# Patient Record
Sex: Female | Born: 1960 | Race: White | Hispanic: No | Smoking: Former smoker
Health system: Southern US, Community
[De-identification: ages and names within clinical notes are randomized; demographics above are authoritative.]

## PROBLEM LIST (undated history)

## (undated) DIAGNOSIS — B009 Herpesviral infection, unspecified: Secondary | ICD-10-CM

## (undated) DIAGNOSIS — J45909 Unspecified asthma, uncomplicated: Secondary | ICD-10-CM

## (undated) DIAGNOSIS — B1920 Unspecified viral hepatitis C without hepatic coma: Secondary | ICD-10-CM

## (undated) DIAGNOSIS — F319 Bipolar disorder, unspecified: Secondary | ICD-10-CM

## (undated) HISTORY — PX: APPENDECTOMY: SHX54

## (undated) HISTORY — PX: TONSILLECTOMY: SUR1361

---

## 1997-08-01 ENCOUNTER — Inpatient Hospital Stay (HOSPITAL_COMMUNITY): Admission: AD | Admit: 1997-08-01 | Discharge: 1997-08-01 | Payer: Self-pay | Admitting: Obstetrics & Gynecology

## 1997-08-06 ENCOUNTER — Inpatient Hospital Stay (HOSPITAL_COMMUNITY): Admission: RE | Admit: 1997-08-06 | Discharge: 1997-08-06 | Payer: Self-pay | Admitting: Obstetrics & Gynecology

## 1997-08-25 ENCOUNTER — Inpatient Hospital Stay (HOSPITAL_COMMUNITY): Admission: AD | Admit: 1997-08-25 | Discharge: 1997-08-25 | Payer: Self-pay | Admitting: Obstetrics

## 1997-09-09 ENCOUNTER — Encounter: Admission: RE | Admit: 1997-09-09 | Discharge: 1997-12-08 | Payer: Self-pay | Admitting: Obstetrics

## 1997-09-30 ENCOUNTER — Inpatient Hospital Stay (HOSPITAL_COMMUNITY): Admission: AD | Admit: 1997-09-30 | Discharge: 1997-09-30 | Payer: Self-pay | Admitting: Obstetrics & Gynecology

## 1997-10-10 ENCOUNTER — Inpatient Hospital Stay (HOSPITAL_COMMUNITY): Admission: AD | Admit: 1997-10-10 | Discharge: 1997-10-13 | Payer: Self-pay | Admitting: Obstetrics

## 1998-09-19 ENCOUNTER — Emergency Department (HOSPITAL_COMMUNITY): Admission: EM | Admit: 1998-09-19 | Discharge: 1998-09-19 | Payer: Self-pay | Admitting: Emergency Medicine

## 1999-01-25 ENCOUNTER — Encounter: Payer: Self-pay | Admitting: Emergency Medicine

## 1999-01-25 ENCOUNTER — Emergency Department (HOSPITAL_COMMUNITY): Admission: EM | Admit: 1999-01-25 | Discharge: 1999-01-25 | Payer: Self-pay | Admitting: Emergency Medicine

## 2003-10-24 ENCOUNTER — Emergency Department (HOSPITAL_COMMUNITY): Admission: EM | Admit: 2003-10-24 | Discharge: 2003-10-24 | Payer: Self-pay | Admitting: Emergency Medicine

## 2004-07-07 ENCOUNTER — Emergency Department (HOSPITAL_COMMUNITY): Admission: EM | Admit: 2004-07-07 | Discharge: 2004-07-08 | Payer: Self-pay | Admitting: Emergency Medicine

## 2004-07-07 ENCOUNTER — Encounter (INDEPENDENT_AMBULATORY_CARE_PROVIDER_SITE_OTHER): Payer: Self-pay | Admitting: Specialist

## 2005-01-04 ENCOUNTER — Emergency Department (HOSPITAL_COMMUNITY): Admission: EM | Admit: 2005-01-04 | Discharge: 2005-01-04 | Payer: Self-pay | Admitting: Family Medicine

## 2005-04-18 ENCOUNTER — Ambulatory Visit: Payer: Self-pay | Admitting: Internal Medicine

## 2005-05-02 ENCOUNTER — Ambulatory Visit: Payer: Self-pay | Admitting: Internal Medicine

## 2005-05-09 ENCOUNTER — Other Ambulatory Visit: Admission: RE | Admit: 2005-05-09 | Discharge: 2005-05-09 | Payer: Self-pay | Admitting: Internal Medicine

## 2005-05-09 ENCOUNTER — Ambulatory Visit: Payer: Self-pay | Admitting: Internal Medicine

## 2005-05-14 ENCOUNTER — Ambulatory Visit (HOSPITAL_COMMUNITY): Admission: RE | Admit: 2005-05-14 | Discharge: 2005-05-14 | Payer: Self-pay | Admitting: Internal Medicine

## 2005-05-15 ENCOUNTER — Ambulatory Visit: Payer: Self-pay | Admitting: Internal Medicine

## 2006-12-07 IMAGING — CR DG LUMBAR SPINE COMPLETE 4+V
5 series · 5 of 5 positions shown · non-contrast
Comparison: none

CLINICAL DATA: Low neck pain.  Assess for osteoarthritis.  
CERVICAL SPINE ? 7 VIEW:
CLINICAL DATA: Pain between the shoulders.
THORACIC SPINE ? 2 VIEW:
CLINICAL DATA: Low mid back pain.
LUMBAR SPINE ? 5 VIEW:

[view not recorded (1 of 5)]
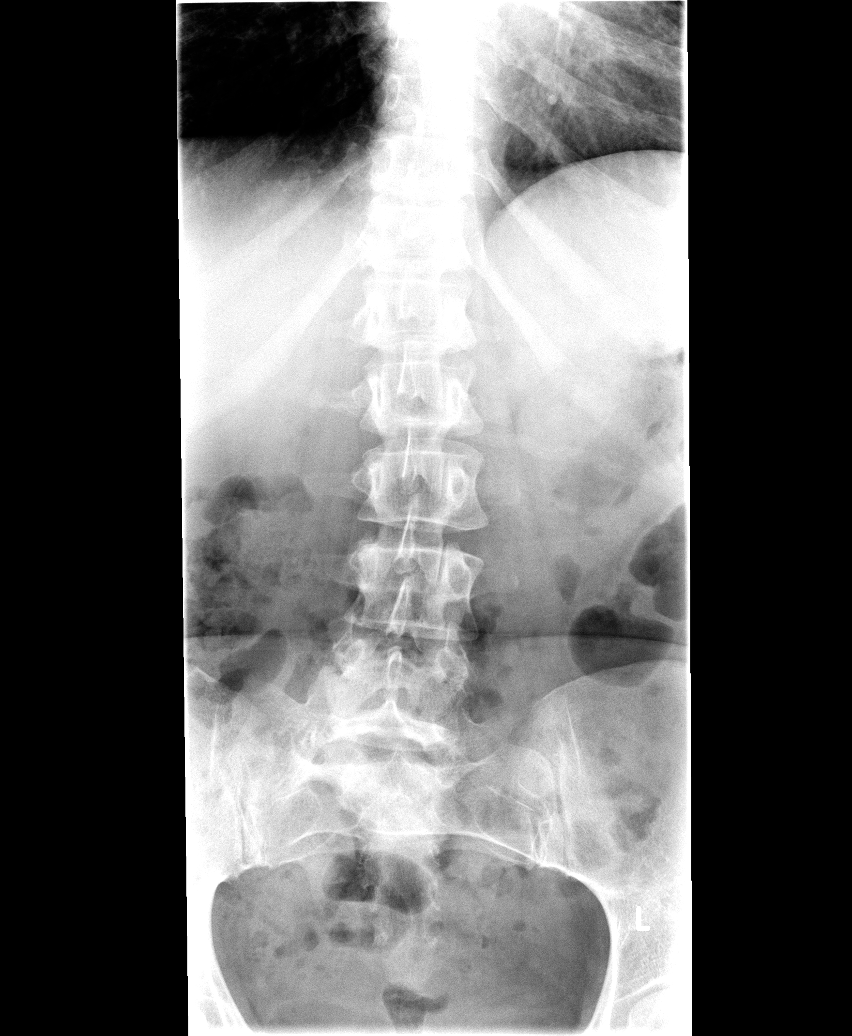

[view not recorded (2 of 5)]
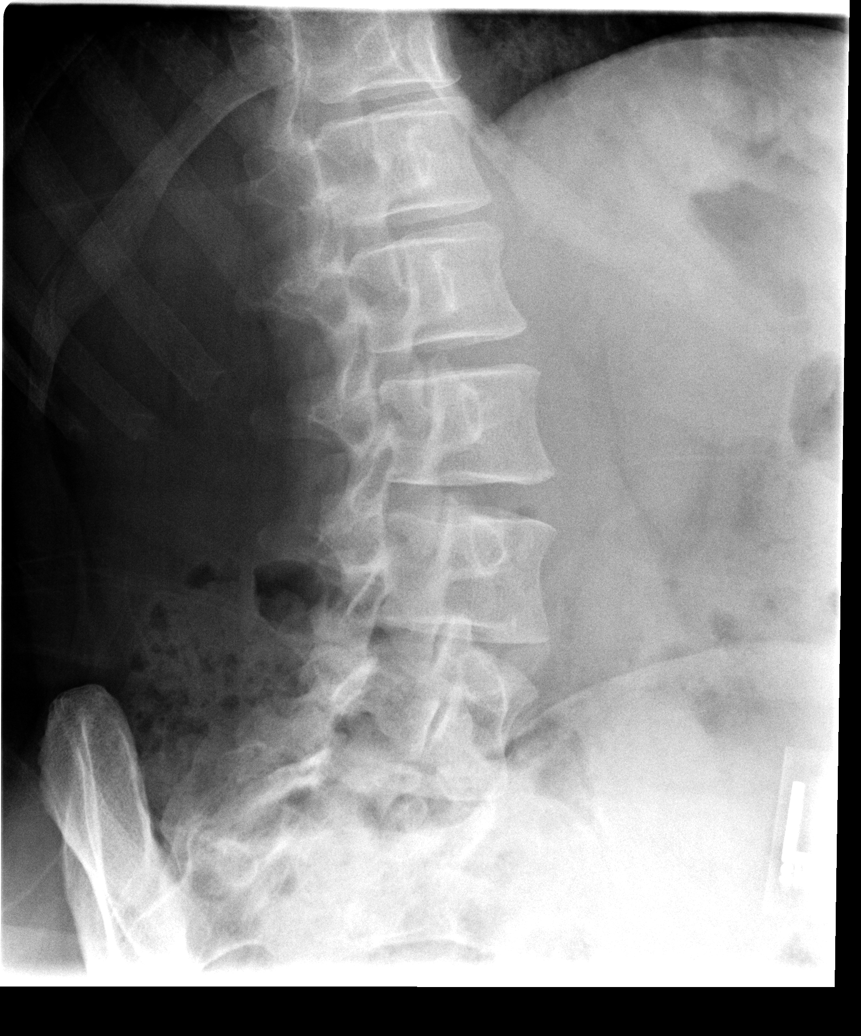

[view not recorded (3 of 5)]
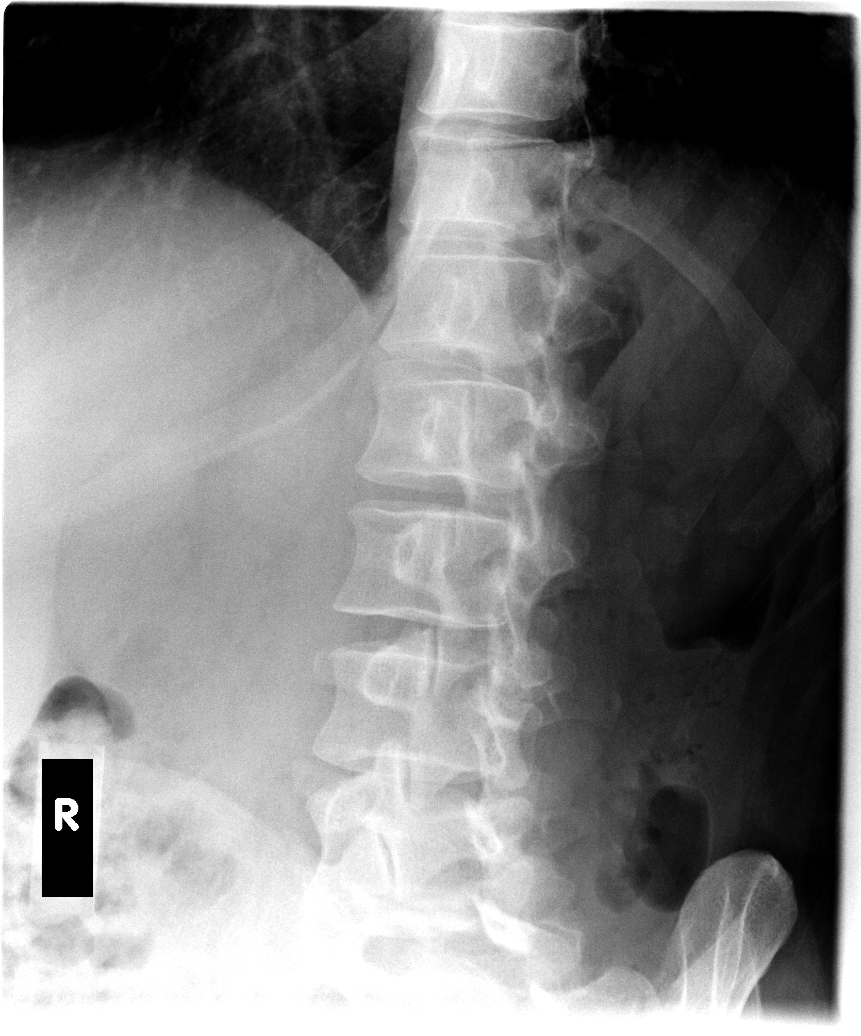

[view not recorded (4 of 5)]
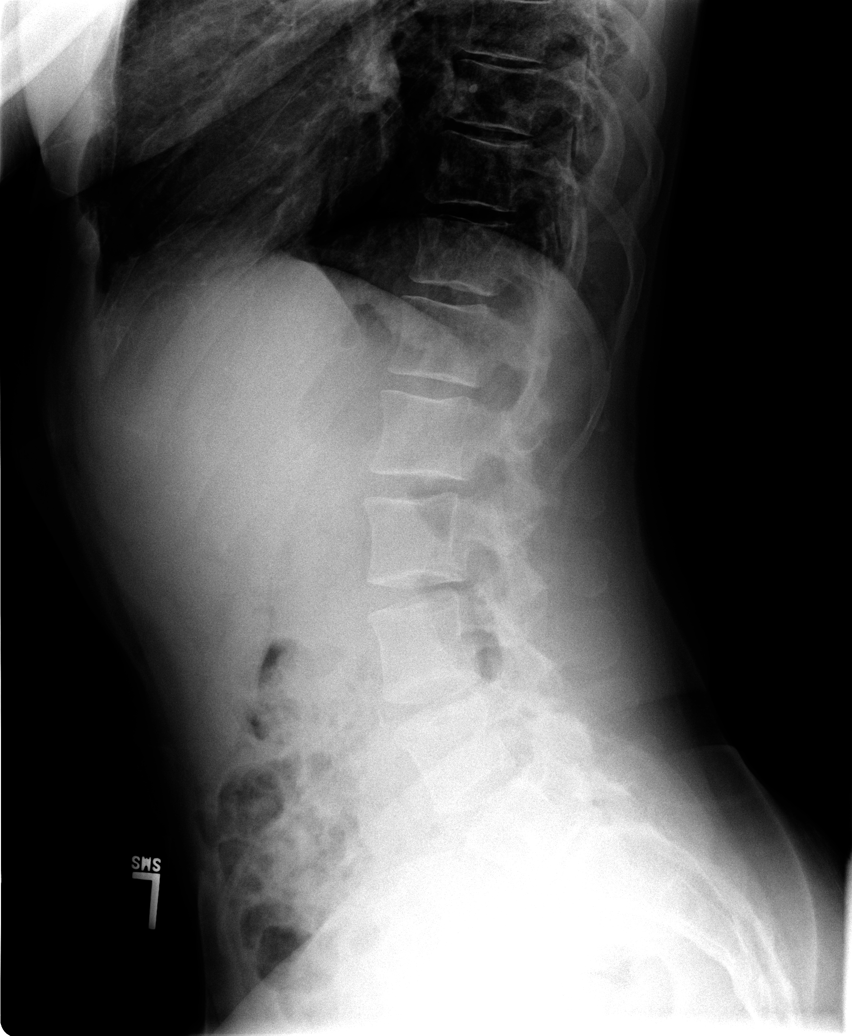

[view not recorded (5 of 5)]
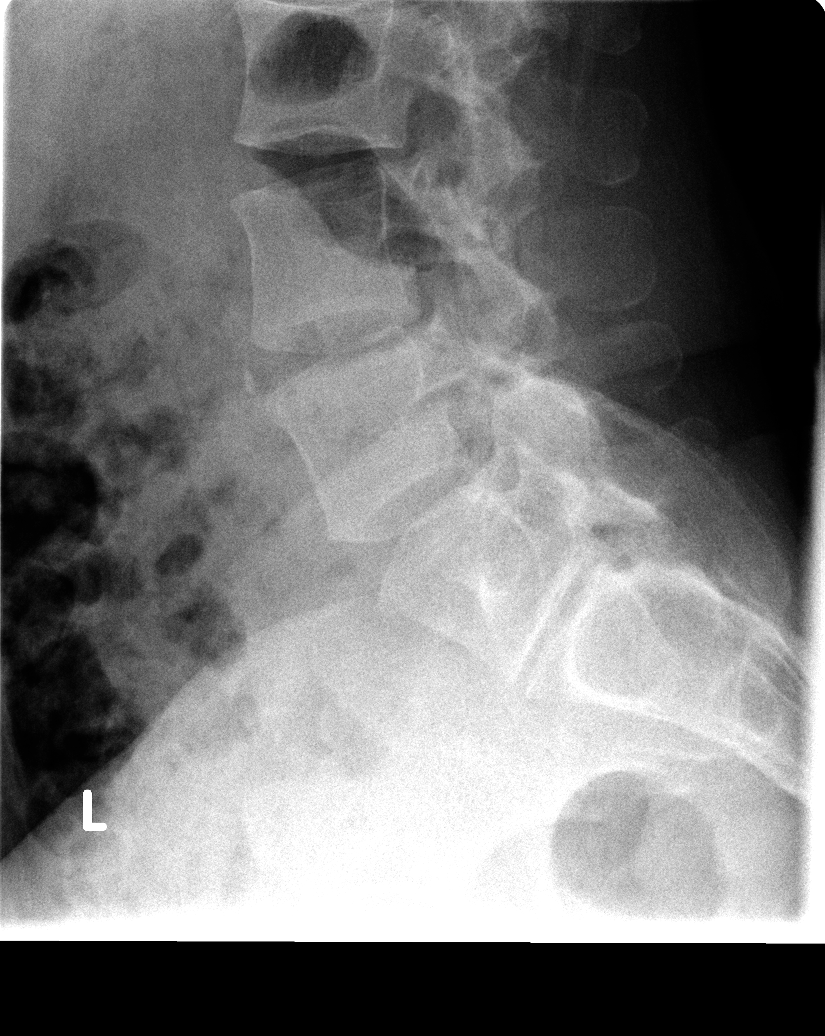

[5 of 5 positions shown; findings below may reference images not displayed]

FINDINGS: Seven views of the neck were obtained.  Bone density is within normal limits.  
The lateral view demonstrates a loss of the normal cervical lordotic curve with slightly diminished vertebral height at the C5-6 level.  arked disc space narrowing at this level with associated anterior osteophytosis and uncovertebral spurring bilaterally with associated foraminal narrowing at the C5-6 level.  These changes are compatible with osteoarthritis.  The remainder of the vertebral bodies appear within normal limits and the other foramina appear patent.  No focal soft tissue abnormalities are seen and the lung apices appear clear.  No acute bony abnormalities are noted.
IMPRESSION: Degenerative changes at the C5-6 level with associated foraminal narrowing due to bilateral uncovertebral spurring.
FINDINGS: Bone density is within normal limits.  There is very mild curvature of the thoracic spine convex right.  Vertebral body heights and disc spaces appear maintained.  Bony alignment appears intact.  No acute bony abnormality is seen.  There is mild anterior vertebral body lipping at the C6, 7 and 8 levels suggestive of mild and very early degenerative changes.
IMPRESSION: Mild early degenerative changes of the mid thoracic spine as noted above.
FINDINGS: Bone density is within normal limits. There is a mild compensatory curve of the lumbar spine convex left with a slight rotatory component.  There are five normal appearing lumbar vertebral bodies with a sixth transitional vertebra identified.  This L6 transitional vertebra demonstrates assimilation with the sacroiliac joint on the right, but not on the left.  
Bony alignment appears intact.  There is a rudimentary joint space identified between the L6 vertebral body and the sacrum.  Oblique views demonstrate no evidence for spondylosis or spondylolisthesis.  No bony degenerative changes are evident.
IMPRESSION: Development anomaly of a transitional L6 vertebral body as described above.  The patient?s mild spinal curvature with rotatory component of the lumbar spine is likely related to the asymmetry of this transitional L6 vertebral body.  No significant degenerative changes are evident and no acute bony abnormalities are noted in the lumbar spine.

## 2006-12-07 IMAGING — CR DG THORACIC SPINE 2V
2 series · 2 of 2 positions shown · non-contrast
Comparison: none

CLINICAL DATA: Low neck pain.  Assess for osteoarthritis.  
CERVICAL SPINE ? 7 VIEW:
CLINICAL DATA: Pain between the shoulders.
THORACIC SPINE ? 2 VIEW:
CLINICAL DATA: Low mid back pain.
LUMBAR SPINE ? 5 VIEW:

[view not recorded (1 of 2)]
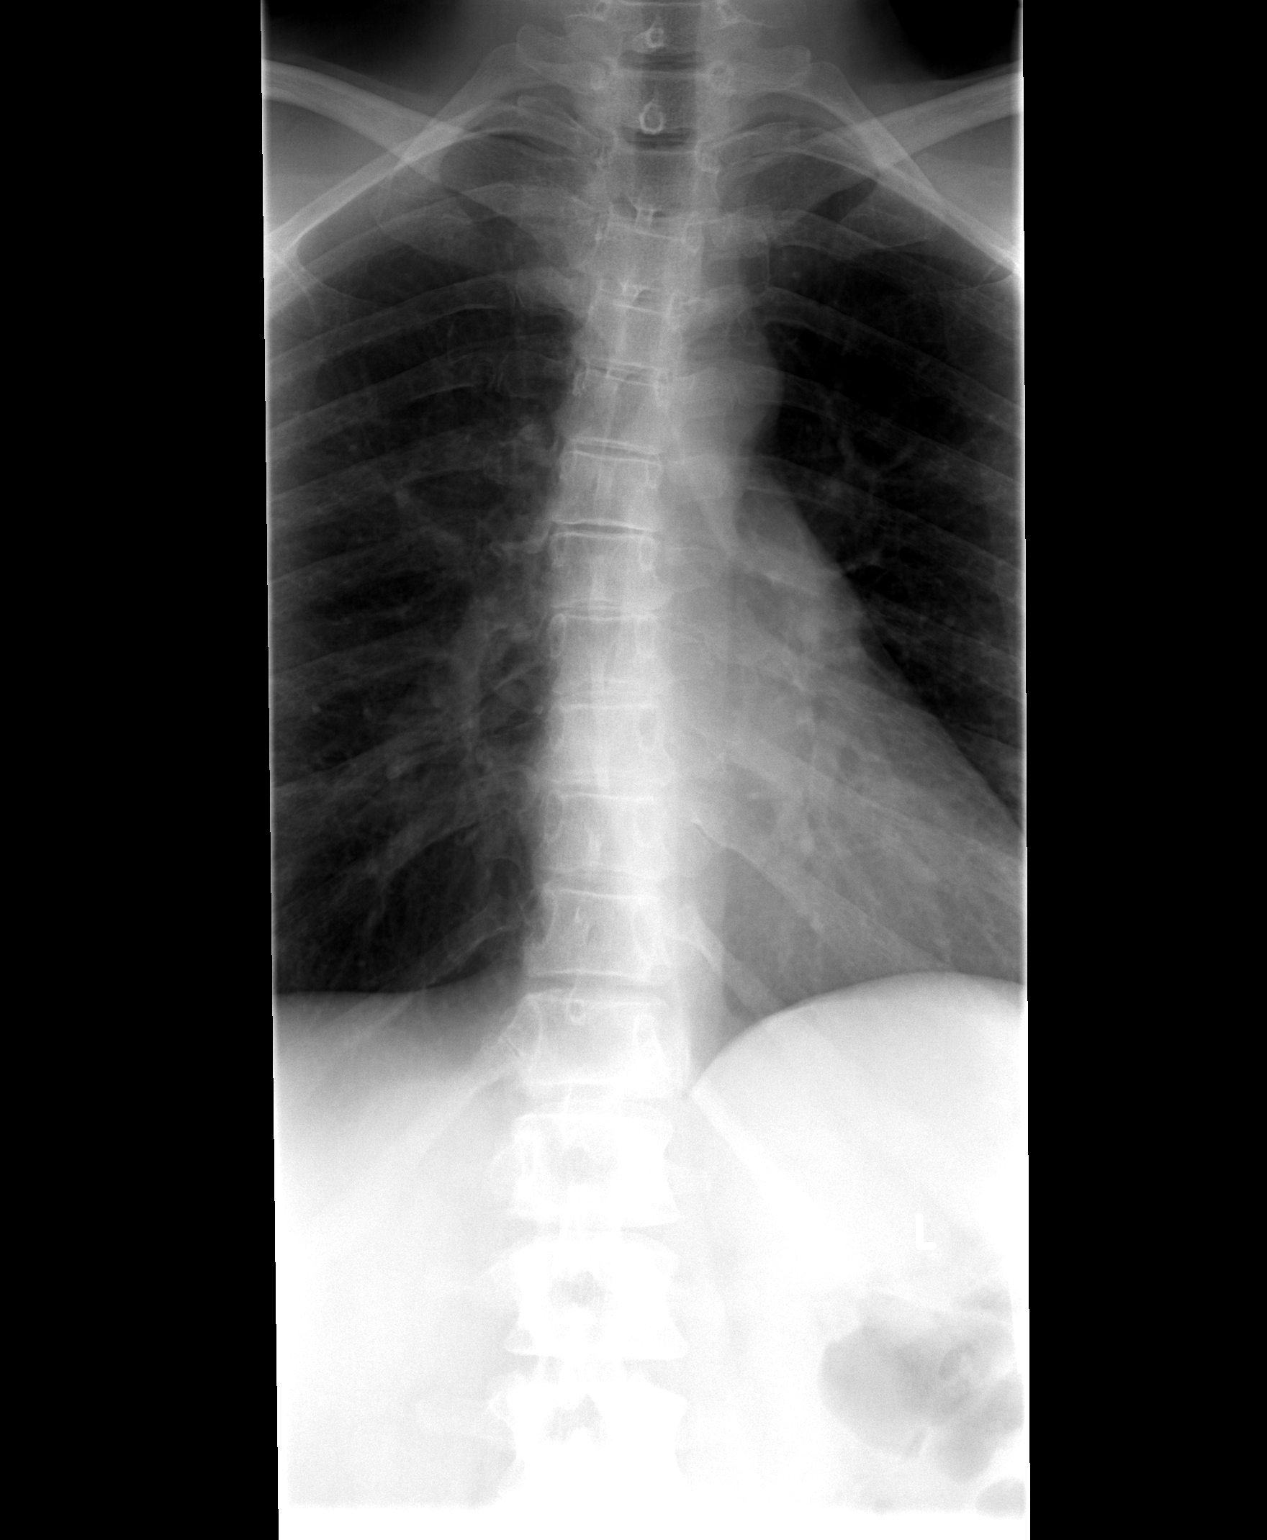

[view not recorded (2 of 2)]
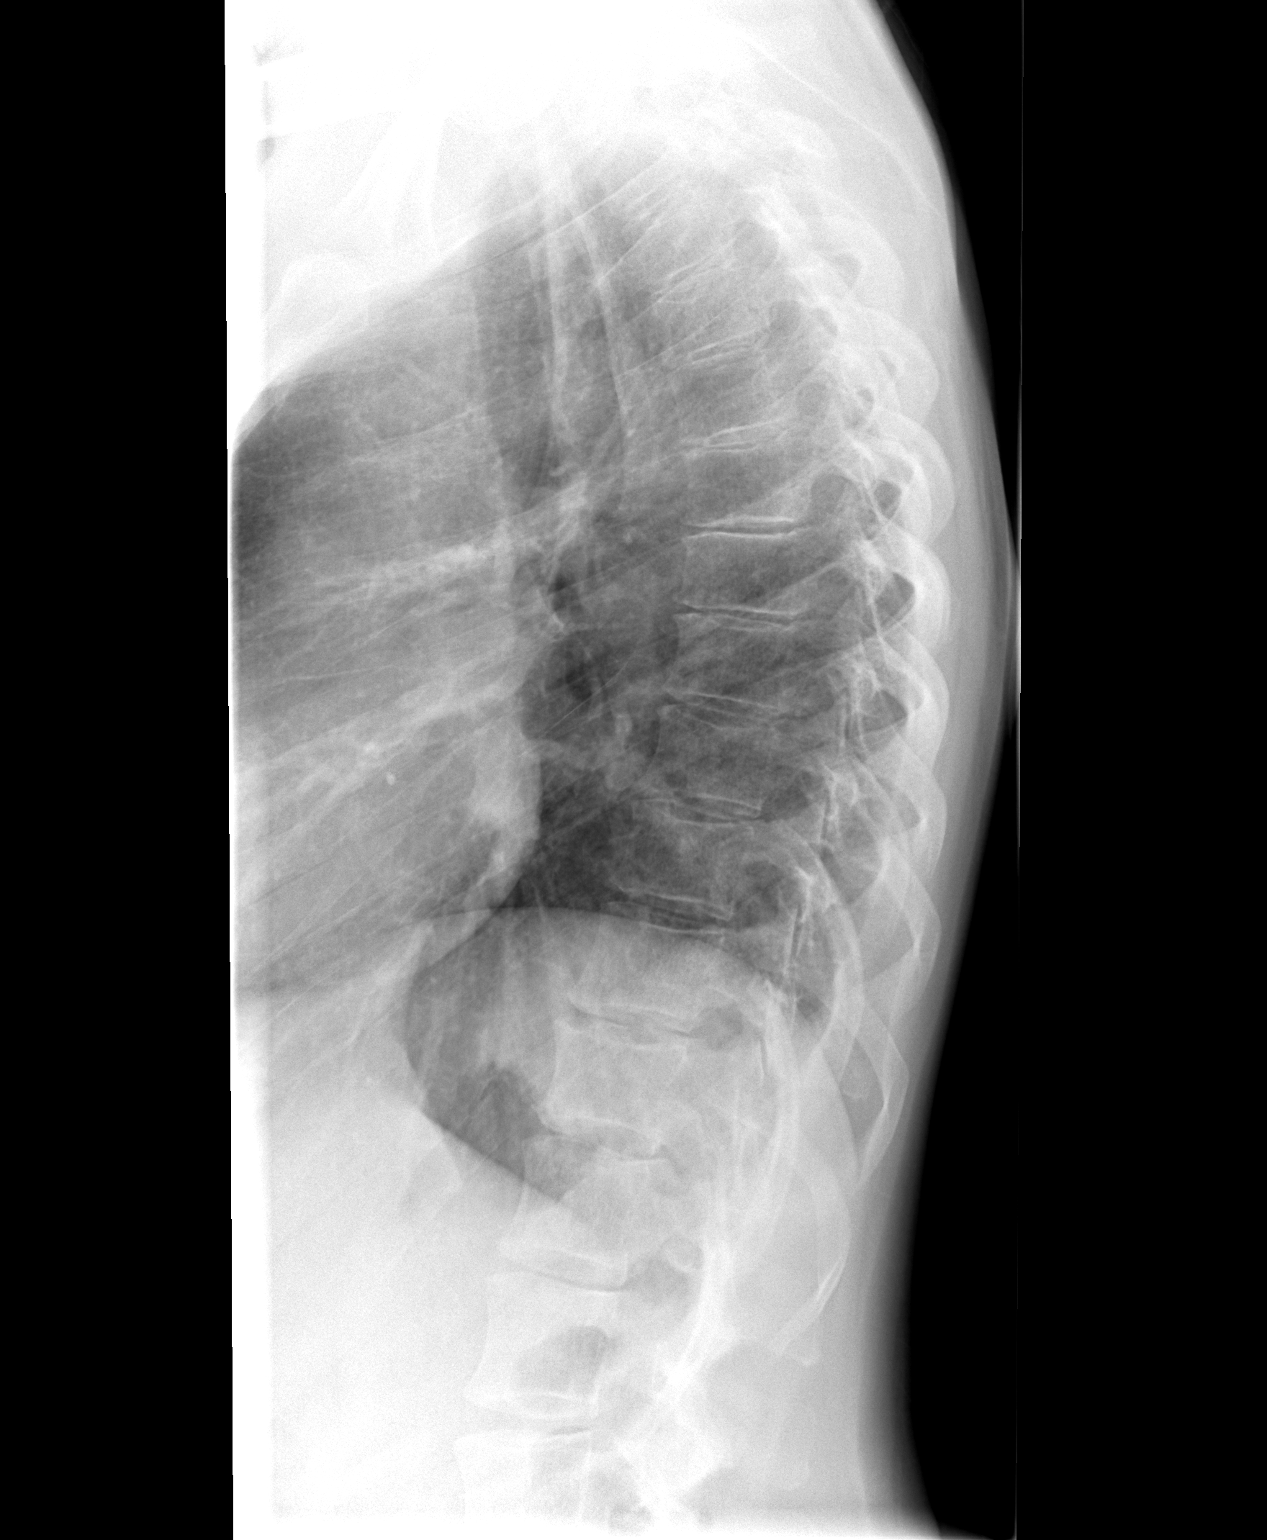

[2 of 2 positions shown; findings below may reference images not displayed]

FINDINGS: Seven views of the neck were obtained.  Bone density is within normal limits.  
The lateral view demonstrates a loss of the normal cervical lordotic curve with slightly diminished vertebral height at the C5-6 level.  arked disc space narrowing at this level with associated anterior osteophytosis and uncovertebral spurring bilaterally with associated foraminal narrowing at the C5-6 level.  These changes are compatible with osteoarthritis.  The remainder of the vertebral bodies appear within normal limits and the other foramina appear patent.  No focal soft tissue abnormalities are seen and the lung apices appear clear.  No acute bony abnormalities are noted.
IMPRESSION: Degenerative changes at the C5-6 level with associated foraminal narrowing due to bilateral uncovertebral spurring.
FINDINGS: Bone density is within normal limits.  There is very mild curvature of the thoracic spine convex right.  Vertebral body heights and disc spaces appear maintained.  Bony alignment appears intact.  No acute bony abnormality is seen.  There is mild anterior vertebral body lipping at the C6, 7 and 8 levels suggestive of mild and very early degenerative changes.
IMPRESSION: Mild early degenerative changes of the mid thoracic spine as noted above.
FINDINGS: Bone density is within normal limits. There is a mild compensatory curve of the lumbar spine convex left with a slight rotatory component.  There are five normal appearing lumbar vertebral bodies with a sixth transitional vertebra identified.  This L6 transitional vertebra demonstrates assimilation with the sacroiliac joint on the right, but not on the left.  
Bony alignment appears intact.  There is a rudimentary joint space identified between the L6 vertebral body and the sacrum.  Oblique views demonstrate no evidence for spondylosis or spondylolisthesis.  No bony degenerative changes are evident.
IMPRESSION: Development anomaly of a transitional L6 vertebral body as described above.  The patient?s mild spinal curvature with rotatory component of the lumbar spine is likely related to the asymmetry of this transitional L6 vertebral body.  No significant degenerative changes are evident and no acute bony abnormalities are noted in the lumbar spine.

## 2009-07-20 ENCOUNTER — Encounter: Payer: Self-pay | Admitting: Internal Medicine

## 2009-07-20 DIAGNOSIS — F438 Other reactions to severe stress: Secondary | ICD-10-CM

## 2010-07-11 NOTE — Assessment & Plan Note (Signed)
    Impression & Recommendations:  Problem # 1:  OTHER ACUTE REACTIONS TO STRESS (ICD-308.3) I have been caring for Emma May's father for the last several months. He has been hospitalized several times and his health has declined greatly. Today we got them hooked up with Hospice. She is exhausted and very stressed. In the past she has seen Dr. Wyonia Hough but has not been in for a while. I have given her a prescription for Ativan .5mg  1/2-2 by mouth three times a day as needed #60, with one refill. She is to let me know how she is doing in a week.  Problem # 2:  Preventive Health Care (ICD-V70.0) Pt. needs to come in for primary care once things have settled down.  Complete Medication List: 1)  Lorazepam 0.5 Mg Tabs (Lorazepam) .... 1/2-2 tablets 3 times a day as needed

## 2010-08-07 ENCOUNTER — Telehealth: Payer: Self-pay | Admitting: *Deleted

## 2010-08-07 NOTE — Telephone Encounter (Signed)
Incorrect pt  

## 2012-06-25 ENCOUNTER — Emergency Department (HOSPITAL_COMMUNITY): Payer: Medicaid Other

## 2012-06-25 ENCOUNTER — Observation Stay (HOSPITAL_COMMUNITY)
Admission: EM | Admit: 2012-06-25 | Discharge: 2012-06-27 | Disposition: A | Discharge status: Home / Self Care | Payer: Medicaid Other | Attending: General Surgery | Admitting: General Surgery

## 2012-06-25 ENCOUNTER — Encounter (HOSPITAL_COMMUNITY): Payer: Self-pay | Admitting: Orthopedic Surgery

## 2012-06-25 DIAGNOSIS — S42109A Fracture of unspecified part of scapula, unspecified shoulder, initial encounter for closed fracture: Secondary | ICD-10-CM

## 2012-06-25 DIAGNOSIS — M47812 Spondylosis without myelopathy or radiculopathy, cervical region: Secondary | ICD-10-CM | POA: Insufficient documentation

## 2012-06-25 DIAGNOSIS — S27329A Contusion of lung, unspecified, initial encounter: Secondary | ICD-10-CM

## 2012-06-25 DIAGNOSIS — I6529 Occlusion and stenosis of unspecified carotid artery: Secondary | ICD-10-CM | POA: Insufficient documentation

## 2012-06-25 DIAGNOSIS — F319 Bipolar disorder, unspecified: Secondary | ICD-10-CM | POA: Insufficient documentation

## 2012-06-25 DIAGNOSIS — S20419A Abrasion of unspecified back wall of thorax, initial encounter: Secondary | ICD-10-CM | POA: Diagnosis present

## 2012-06-25 DIAGNOSIS — J438 Other emphysema: Secondary | ICD-10-CM | POA: Insufficient documentation

## 2012-06-25 DIAGNOSIS — R0902 Hypoxemia: Secondary | ICD-10-CM | POA: Insufficient documentation

## 2012-06-25 DIAGNOSIS — IMO0002 Reserved for concepts with insufficient information to code with codable children: Secondary | ICD-10-CM | POA: Insufficient documentation

## 2012-06-25 DIAGNOSIS — M542 Cervicalgia: Secondary | ICD-10-CM | POA: Insufficient documentation

## 2012-06-25 DIAGNOSIS — M25519 Pain in unspecified shoulder: Secondary | ICD-10-CM | POA: Insufficient documentation

## 2012-06-25 DIAGNOSIS — B1920 Unspecified viral hepatitis C without hepatic coma: Secondary | ICD-10-CM | POA: Insufficient documentation

## 2012-06-25 DIAGNOSIS — Z23 Encounter for immunization: Secondary | ICD-10-CM | POA: Insufficient documentation

## 2012-06-25 HISTORY — DX: Unspecified asthma, uncomplicated: J45.909

## 2012-06-25 HISTORY — DX: Herpesviral infection, unspecified: B00.9

## 2012-06-25 HISTORY — DX: Unspecified viral hepatitis C without hepatic coma: B19.20

## 2012-06-25 HISTORY — DX: Bipolar disorder, unspecified: F31.9

## 2012-06-25 LAB — COMPREHENSIVE METABOLIC PANEL
AST: 34 U/L (ref 0–37)
Albumin: 3.5 g/dL (ref 3.5–5.2)
Alkaline Phosphatase: 145 U/L — ABNORMAL HIGH (ref 39–117)
BUN: 13 mg/dL (ref 6–23)
Chloride: 106 mEq/L (ref 96–112)
Potassium: 3.1 mEq/L — ABNORMAL LOW (ref 3.5–5.1)
Total Bilirubin: 0.5 mg/dL (ref 0.3–1.2)

## 2012-06-25 LAB — CBC
MCH: 31.1 pg (ref 26.0–34.0)
MCV: 92.6 fL (ref 78.0–100.0)
Platelets: 228 10*3/uL (ref 150–400)
RDW: 12.9 % (ref 11.5–15.5)

## 2012-06-25 LAB — PROTIME-INR: INR: 0.93 (ref 0.00–1.49)

## 2012-06-25 LAB — SAMPLE TO BLOOD BANK

## 2012-06-25 LAB — POCT I-STAT, CHEM 8
Glucose, Bld: 136 mg/dL — ABNORMAL HIGH (ref 70–99)
HCT: 40 % (ref 36.0–46.0)
Hemoglobin: 13.6 g/dL (ref 12.0–15.0)
Potassium: 3.1 mEq/L — ABNORMAL LOW (ref 3.5–5.1)
Sodium: 143 mEq/L (ref 135–145)
TCO2: 26 mmol/L (ref 0–100)

## 2012-06-25 MED ORDER — MORPHINE SULFATE 2 MG/ML IJ SOLN
INTRAMUSCULAR | Status: AC
  Filled 2012-06-25: qty 2

## 2012-06-25 MED ORDER — FENTANYL CITRATE 0.05 MG/ML IJ SOLN
INTRAMUSCULAR | Status: AC
  Filled 2012-06-25: qty 2

## 2012-06-25 MED ORDER — HYDROCODONE-ACETAMINOPHEN 10-325 MG PO TABS
0.5000 | ORAL_TABLET | ORAL | Status: DC | PRN
  Administered 2012-06-25 (×2): 1 via ORAL
  Filled 2012-06-25 (×2): qty 1

## 2012-06-25 MED ORDER — SODIUM CHLORIDE 0.9 % IJ SOLN: 3.0000 mL | INTRAMUSCULAR | Status: DC | PRN

## 2012-06-25 MED ORDER — ONDANSETRON HCL 4 MG/2ML IJ SOLN
INTRAMUSCULAR | Status: AC
  Filled 2012-06-25: qty 2

## 2012-06-25 MED ORDER — ONDANSETRON HCL 8 MG PO TABS
4.0000 mg | ORAL_TABLET | Freq: Four times a day (QID) | ORAL | Status: DC | PRN
  Filled 2012-06-25: qty 0.5

## 2012-06-25 MED ORDER — PNEUMOCOCCAL VAC POLYVALENT 25 MCG/0.5ML IJ INJ
0.5000 mL | INJECTION | INTRAMUSCULAR | Status: AC
  Administered 2012-06-26: 0.5 mL via INTRAMUSCULAR
  Filled 2012-06-25: qty 0.5

## 2012-06-25 MED ORDER — SODIUM CHLORIDE 0.9 % IJ SOLN
3.0000 mL | Freq: Two times a day (BID) | INTRAMUSCULAR | Status: DC
  Administered 2012-06-26 (×2): 3 mL via INTRAVENOUS

## 2012-06-25 MED ORDER — OXYCODONE-ACETAMINOPHEN 5-325 MG PO TABS: 1.0000 | ORAL_TABLET | Freq: Four times a day (QID) | ORAL | Status: DC | PRN

## 2012-06-25 MED ORDER — ONDANSETRON HCL 4 MG/2ML IJ SOLN
4.0000 mg | Freq: Once | INTRAMUSCULAR | Status: AC
  Administered 2012-06-25: 4 mg via INTRAVENOUS

## 2012-06-25 MED ORDER — VILAZODONE HCL 20 MG PO TABS
20.0000 mg | ORAL_TABLET | Freq: Every day | ORAL | Status: DC
  Filled 2012-06-25: qty 1

## 2012-06-25 MED ORDER — IOHEXOL 300 MG/ML  SOLN
100.0000 mL | Freq: Once | INTRAMUSCULAR | Status: AC | PRN
  Administered 2012-06-25: 80 mL via INTRAVENOUS

## 2012-06-25 MED ORDER — POLYETHYLENE GLYCOL 3350 17 G PO PACK
17.0000 g | PACK | Freq: Every day | ORAL | Status: DC
  Administered 2012-06-26: 17 g via ORAL
  Filled 2012-06-25 (×3): qty 1

## 2012-06-25 MED ORDER — TETANUS-DIPHTHERIA TOXOIDS TD 5-2 LFU IM INJ
0.5000 mL | INJECTION | Freq: Once | INTRAMUSCULAR | Status: AC
  Administered 2012-06-25: 0.5 mL via INTRAMUSCULAR
  Filled 2012-06-25: qty 0.5

## 2012-06-25 MED ORDER — GABAPENTIN 400 MG PO CAPS
400.0000 mg | ORAL_CAPSULE | Freq: Every day | ORAL | Status: DC
  Administered 2012-06-25 – 2012-06-26 (×2): 400 mg via ORAL
  Filled 2012-06-25 (×3): qty 1

## 2012-06-25 MED ORDER — VILAZODONE HCL 20 MG PO TABS: 20.0000 mg | ORAL_TABLET | Freq: Every day | ORAL | Status: DC

## 2012-06-25 MED ORDER — FENTANYL CITRATE 0.05 MG/ML IJ SOLN
50.0000 ug | Freq: Once | INTRAMUSCULAR | Status: AC
  Administered 2012-06-25: 50 ug via INTRAVENOUS

## 2012-06-25 MED ORDER — LITHIUM CARBONATE 300 MG PO CAPS
300.0000 mg | ORAL_CAPSULE | Freq: Two times a day (BID) | ORAL | Status: DC
  Administered 2012-06-25 – 2012-06-27 (×4): 300 mg via ORAL
  Filled 2012-06-25 (×6): qty 1

## 2012-06-25 MED ORDER — SODIUM CHLORIDE 0.9 % IV SOLN: 250.0000 mL | INTRAVENOUS | Status: DC | PRN

## 2012-06-25 MED ORDER — FENTANYL CITRATE 0.05 MG/ML IJ SOLN
50.0000 ug | Freq: Once | INTRAMUSCULAR | Status: AC
  Administered 2012-06-25: 50 ug via INTRAVENOUS
  Filled 2012-06-25: qty 2

## 2012-06-25 MED ORDER — ONDANSETRON HCL 4 MG/2ML IJ SOLN
4.0000 mg | Freq: Four times a day (QID) | INTRAMUSCULAR | Status: DC | PRN
  Administered 2012-06-25 – 2012-06-26 (×3): 4 mg via INTRAVENOUS
  Filled 2012-06-25 (×3): qty 2

## 2012-06-25 MED ORDER — DOCUSATE SODIUM 100 MG PO CAPS
100.0000 mg | ORAL_CAPSULE | Freq: Two times a day (BID) | ORAL | Status: DC
  Administered 2012-06-25 – 2012-06-26 (×3): 100 mg via ORAL
  Filled 2012-06-25 (×3): qty 1

## 2012-06-25 MED ORDER — OXYCODONE HCL 5 MG PO TABS
5.0000 mg | ORAL_TABLET | ORAL | Status: DC | PRN
  Filled 2012-06-25: qty 2

## 2012-06-25 MED ORDER — GABAPENTIN 400 MG PO CAPS
400.0000 mg | ORAL_CAPSULE | Freq: Every day | ORAL | Status: DC
  Filled 2012-06-25: qty 2

## 2012-06-25 MED ORDER — FENTANYL CITRATE 0.05 MG/ML IJ SOLN
50.0000 ug | INTRAMUSCULAR | Status: DC | PRN
  Administered 2012-06-25 – 2012-06-26 (×3): 50 ug via INTRAVENOUS
  Filled 2012-06-25 (×3): qty 2

## 2012-06-25 MED ORDER — MORPHINE SULFATE 4 MG/ML IJ SOLN
4.0000 mg | INTRAMUSCULAR | Status: DC | PRN
  Administered 2012-06-25: 4 mg via INTRAVENOUS

## 2012-06-25 MED ORDER — ENOXAPARIN SODIUM 40 MG/0.4ML ~~LOC~~ SOLN
40.0000 mg | SUBCUTANEOUS | Status: DC
  Administered 2012-06-25 – 2012-06-26 (×2): 40 mg via SUBCUTANEOUS
  Filled 2012-06-25 (×3): qty 0.4
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
## 2012-06-25 NOTE — ED Notes (Signed)
Returned from ct scan. Family at beside.
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
## 2012-06-25 NOTE — ED Notes (Signed)
Emergency blood arrived to room
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
## 2012-06-25 NOTE — ED Notes (Signed)
Results of lactic acid shown to the attending MD, Jeraldine Loots
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
## 2012-06-25 NOTE — Progress Notes (Signed)
Orthopedic Tech Progress Note Patient Details:  Emma May [birth year] [id number]  Ortho Devices Type of Ortho Device: Sling immobilizer Ortho Device/Splint Location: LEFT SLING Ortho Device/Splint Interventions: Application   Cammer, Mickie Bail 06/25/2012, 9:27 AM

## 2012-06-25 NOTE — Progress Notes (Signed)
UR complete 

## 2012-06-26 ENCOUNTER — Observation Stay (HOSPITAL_COMMUNITY): Payer: Medicaid Other

## 2012-06-26 LAB — CDS SEROLOGY

## 2012-06-26 LAB — URINALYSIS, MICROSCOPIC ONLY
Bilirubin Urine: NEGATIVE
Ketones, ur: NEGATIVE mg/dL
Nitrite: NEGATIVE
Urobilinogen, UA: 1 mg/dL (ref 0.0–1.0)

## 2012-06-26 MED ORDER — TRAMADOL HCL 50 MG PO TABS
50.0000 mg | ORAL_TABLET | Freq: Four times a day (QID) | ORAL | Status: DC | PRN
  Administered 2012-06-26 – 2012-06-27 (×4): 100 mg via ORAL
  Filled 2012-06-26 (×4): qty 2

## 2012-06-26 MED ORDER — NAPROXEN 500 MG PO TABS
500.0000 mg | ORAL_TABLET | Freq: Two times a day (BID) | ORAL | Status: DC
  Administered 2012-06-26 – 2012-06-27 (×2): 500 mg via ORAL
  Filled 2012-06-26 (×4): qty 1

## 2012-06-26 MED ORDER — VILAZODONE HCL 20 MG PO TABS
20.0000 mg | ORAL_TABLET | Freq: Every day | ORAL | Status: DC
  Administered 2012-06-26 – 2012-06-27 (×2): 20 mg via ORAL

## 2012-06-26 MED ORDER — TIZANIDINE HCL 4 MG PO TABS
4.0000 mg | ORAL_TABLET | Freq: Three times a day (TID) | ORAL | Status: DC | PRN
  Filled 2012-06-26: qty 1

## 2012-06-26 MED ORDER — WHITE PETROLATUM GEL
Status: AC
  Administered 2012-06-26: 0.2
  Filled 2012-06-26: qty 5
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
## 2012-06-26 NOTE — Progress Notes (Signed)
SBIRT completed- patient with no current history of substance abuse- she reports she has been sober for 16 years.  Reece Levy, MSW, Theresia Majors [phone]

## 2012-06-26 NOTE — Progress Notes (Signed)
Patient ID: Josephene Marrone, female   DOB: 03-30-1961, 52 y.o.   MRN: [id number]   LOS: 1 day   Subjective: Very sore. Did not tolerate Norco yesterday and was too afraid to try oxycodone. Supplemental oxygen need has fluctuated but currently on 3L.  Objective: Vital signs in last 24 hours: Temp:  [97.9 F (36.6 C)-99 F (37.2 C)] 98.4 F (36.9 C) (01/16 0547) Pulse Rate:  [70-102] 81  (01/16 0547) Resp:  [17-21] 18  (01/16 0547) BP: (91-123)/(53-73) 102/60 mmHg (01/16 0547) SpO2:  [91 %-97 %] 97 % (01/16 0547) Weight:  [170 lb (77.111 kg)] 170 lb (77.111 kg) (01/15 1252) Last BM Date: 06/24/12   IS:   General appearance: alert and no distress Resp: clear to auscultation bilaterally Cardio: regular rate and rhythm GI: normal findings: bowel sounds normal and soft, non-tender Extremities: NVI   Assessment/Plan: PHBC  Left scapula fx -- Seen by Dr. Shelle Iron, plan for sling  Left pulmonary contusion -- Will repeat CXR. Seems doubtful that her relatively mild pulmonary contusion is responsible for her hypoxia. Bipolar d/o -- Home meds FEN -- Will give NSAID and try tramadol prn for pain. VTE -- Lovenox, SCD's Dispo -- Home once pain controlled, hypoxia improved    Freeman Caldron, PA-C Pager: [phone] General Trauma PA Pager: [phone]   06/26/2012

## 2012-06-26 NOTE — H&P (Signed)
Seen, agree with above.  Hypoxic on room air.  Probable pulmonary contusion. Supportive care, pulmonary toilet.
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
## 2012-06-26 NOTE — Progress Notes (Signed)
Having some spasms.  Colace makes her nauseated. Will try Zanaflex and re-check this PM. Patient examined and I agree with the assessment and plan  Violeta Gelinas, MD, MPH, FACS Pager: [phone]  06/26/2012 1:38 PM

## 2012-06-27 MED ORDER — VILAZODONE HCL 20 MG PO TABS: 20.0000 mg | ORAL_TABLET | Freq: Every day | ORAL | Status: AC

## 2012-06-27 MED ORDER — ONDANSETRON HCL 4 MG PO TABS: 4.0000 mg | ORAL_TABLET | Freq: Four times a day (QID) | ORAL | Status: AC | PRN

## 2012-06-27 MED ORDER — NAPROXEN 500 MG PO TABS: 500.0000 mg | ORAL_TABLET | Freq: Two times a day (BID) | ORAL | Status: AC

## 2012-06-27 MED ORDER — TIZANIDINE HCL 4 MG PO TABS: 4.0000 mg | ORAL_TABLET | Freq: Three times a day (TID) | ORAL | Status: AC | PRN

## 2012-06-27 MED ORDER — TRAMADOL HCL 50 MG PO TABS: 50.0000 mg | ORAL_TABLET | Freq: Four times a day (QID) | ORAL | Status: AC | PRN
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
## 2012-06-27 NOTE — Discharge Summary (Signed)
Keenan Dimitrov, MD, MPH, FACS Pager: [phone]  

## 2012-06-27 NOTE — Progress Notes (Signed)
[facility]  Patient Status: New  [facility] is providing the following services: PT  If patient discharges after hours, please call [phone].   Wynelle Bourgeois 06/27/2012, 10:41 AM

## 2012-06-27 NOTE — Progress Notes (Signed)
Pt for HHPT at home.  Address in [facility] is correct and her cell phone is [phone].  Pt aware that she will be billed for the services that could be approximately 150-175$/visit. Arranged with [facility] per pt choice. She plans to borrow the needed DME from a friend to save $.

## 2012-06-27 NOTE — Progress Notes (Signed)
Much improved.  Did great with PT this AM. Patient examined and I agree with the assessment and plan  Violeta Gelinas, MD, MPH, FACS Pager: [phone]  06/27/2012 9:48 AM
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
## 2012-06-27 NOTE — Progress Notes (Signed)
Pt discharged to home discharge instructions explained pt verbalized understanding 

## 2012-08-25 ENCOUNTER — Ambulatory Visit: Payer: Medicaid Other | Attending: Specialist | Admitting: Physical Therapy

## 2012-08-25 DIAGNOSIS — M25676 Stiffness of unspecified foot, not elsewhere classified: Secondary | ICD-10-CM | POA: Insufficient documentation

## 2012-08-25 DIAGNOSIS — IMO0001 Reserved for inherently not codable concepts without codable children: Secondary | ICD-10-CM | POA: Insufficient documentation

## 2012-08-25 DIAGNOSIS — M25673 Stiffness of unspecified ankle, not elsewhere classified: Secondary | ICD-10-CM | POA: Insufficient documentation

## 2012-08-25 DIAGNOSIS — M25579 Pain in unspecified ankle and joints of unspecified foot: Secondary | ICD-10-CM | POA: Insufficient documentation

## 2012-08-25 DIAGNOSIS — M25519 Pain in unspecified shoulder: Secondary | ICD-10-CM | POA: Insufficient documentation

## 2012-09-08 ENCOUNTER — Ambulatory Visit: Payer: Medicaid Other | Admitting: Physical Therapy

## 2012-09-22 ENCOUNTER — Encounter: Payer: Medicaid Other | Admitting: Physical Therapy

## 2012-09-30 ENCOUNTER — Ambulatory Visit: Payer: Medicaid Other | Attending: Specialist | Admitting: Physical Therapy

## 2012-09-30 DIAGNOSIS — M25519 Pain in unspecified shoulder: Secondary | ICD-10-CM | POA: Insufficient documentation

## 2012-09-30 DIAGNOSIS — IMO0001 Reserved for inherently not codable concepts without codable children: Secondary | ICD-10-CM | POA: Insufficient documentation

## 2012-09-30 DIAGNOSIS — M25676 Stiffness of unspecified foot, not elsewhere classified: Secondary | ICD-10-CM | POA: Insufficient documentation

## 2012-09-30 DIAGNOSIS — M25673 Stiffness of unspecified ankle, not elsewhere classified: Secondary | ICD-10-CM | POA: Insufficient documentation

## 2012-09-30 DIAGNOSIS — M25579 Pain in unspecified ankle and joints of unspecified foot: Secondary | ICD-10-CM | POA: Insufficient documentation

## 2012-10-06 ENCOUNTER — Encounter: Payer: Medicaid Other | Admitting: Physical Therapy

## 2012-10-14 ENCOUNTER — Ambulatory Visit: Payer: Medicaid Other | Attending: Specialist | Admitting: Physical Therapy

## 2012-10-14 DIAGNOSIS — IMO0001 Reserved for inherently not codable concepts without codable children: Secondary | ICD-10-CM | POA: Insufficient documentation

## 2012-10-14 DIAGNOSIS — M25519 Pain in unspecified shoulder: Secondary | ICD-10-CM | POA: Insufficient documentation

## 2012-10-14 DIAGNOSIS — M25676 Stiffness of unspecified foot, not elsewhere classified: Secondary | ICD-10-CM | POA: Insufficient documentation

## 2012-10-14 DIAGNOSIS — M25579 Pain in unspecified ankle and joints of unspecified foot: Secondary | ICD-10-CM | POA: Insufficient documentation

## 2012-10-14 DIAGNOSIS — M25673 Stiffness of unspecified ankle, not elsewhere classified: Secondary | ICD-10-CM | POA: Insufficient documentation

## 2014-10-17 ENCOUNTER — Emergency Department (HOSPITAL_BASED_OUTPATIENT_CLINIC_OR_DEPARTMENT_OTHER)
Admission: EM | Admit: 2014-10-17 | Discharge: 2014-10-17 | Disposition: A | Discharge status: Home / Self Care | Payer: Medicaid Other | Attending: Emergency Medicine | Admitting: Emergency Medicine

## 2014-10-17 ENCOUNTER — Encounter (HOSPITAL_BASED_OUTPATIENT_CLINIC_OR_DEPARTMENT_OTHER): Payer: Self-pay | Admitting: *Deleted

## 2014-10-17 DIAGNOSIS — J45909 Unspecified asthma, uncomplicated: Secondary | ICD-10-CM | POA: Insufficient documentation

## 2014-10-17 DIAGNOSIS — Z87891 Personal history of nicotine dependence: Secondary | ICD-10-CM | POA: Diagnosis not present

## 2014-10-17 DIAGNOSIS — Z79899 Other long term (current) drug therapy: Secondary | ICD-10-CM | POA: Diagnosis not present

## 2014-10-17 DIAGNOSIS — H10213 Acute toxic conjunctivitis, bilateral: Secondary | ICD-10-CM | POA: Diagnosis not present

## 2014-10-17 DIAGNOSIS — T65891A Toxic effect of other specified substances, accidental (unintentional), initial encounter: Secondary | ICD-10-CM | POA: Diagnosis not present

## 2014-10-17 DIAGNOSIS — Z791 Long term (current) use of non-steroidal anti-inflammatories (NSAID): Secondary | ICD-10-CM | POA: Diagnosis not present

## 2014-10-17 DIAGNOSIS — F319 Bipolar disorder, unspecified: Secondary | ICD-10-CM | POA: Insufficient documentation

## 2014-10-17 DIAGNOSIS — Z8619 Personal history of other infectious and parasitic diseases: Secondary | ICD-10-CM | POA: Diagnosis not present

## 2014-10-17 DIAGNOSIS — H109 Unspecified conjunctivitis: Secondary | ICD-10-CM | POA: Diagnosis present

## 2014-10-17 MED ORDER — TETRACAINE HCL 0.5 % OP SOLN
2.0000 [drp] | Freq: Once | OPHTHALMIC | Status: AC
  Administered 2014-10-17: 2 [drp] via OPHTHALMIC
  Filled 2014-10-17: qty 2

## 2014-10-17 MED ORDER — FLUORESCEIN SODIUM 1 MG OP STRP
1.0000 | ORAL_STRIP | Freq: Once | OPHTHALMIC | Status: AC
  Administered 2014-10-17: 1 via OPHTHALMIC
  Filled 2014-10-17: qty 1

## 2014-10-17 NOTE — Discharge Instructions (Signed)
Chemical Conjunctivitis Chemical conjunctivitis is an irritation of the underside of the eyelid and the white part of the eye. Conjunctivitis can be caused by infection, allergy or chemical irritation. In your case it has been caused by a chemical irritation of the eye. Symptoms almost always include: tearing, light sensitivity, gritty feeling (sensation) in the eyes, swelling of your eyelids, and often severe pain. In spite of the severe pain, this irritation will run its course and will improve within 24 hours.  HOME CARE INSTRUCTIONS   To ease discomfort apply a cool, clean wash cloth to your eye for 10 to 20 minutes, 3 to 4 times per day.  Do not rub your eyes.  Gently wipe away any discharge from the eyes with moistened tissues.  Wash your hands often with soap and use paper towels to dry.  Sunglasses may be helpful if light bothers your eyes.  Do not use eye make-up.  Do not use contact lenses until the irritation is gone.  Do not operate machinery or drive if your vision is blurred.  Take medications as directed by your caregiver. Artificial tears may ease discomfort.  Avoid the chemical or surroundings which caused the problem. Always use eye protection as necessary. SEEK MEDICAL CARE IF:   The eye is still pink (inflamed) 3 days after beginning treatment.  Pain in the eye increases.  You have discharge coming from either eye.  Your eyelids are stuck together in the morning.  You have an increased sensitivity to light.  An oral temperature above 102 F (38.9 C) develops.  You develop facial pain.  You have any problems that may be related to the medicine you are taking. SEEK IMMEDIATE MEDICAL CARE IF:   Your vision is getting worse.  You develop severe eye pain. MAKE SURE YOU:   Understand these instructions.  Will watch your condition.  Will get help right away if you are not doing well or get worse. Document Released: 03/07/2005 Document Revised:  08/20/2011 Document Reviewed: 01/14/2008 ExitCare Patient Information 2015 [employer]. This information is not intended to replace advice given to you by your health care provider. Make sure you discuss any questions you have with your health care provider.  

## 2014-10-17 NOTE — ED Provider Notes (Signed)
CSN: [id number]     Arrival date & time 10/17/14  0654 History   First MD Initiated Contact with Patient 10/17/14 [phone]     Chief Complaint  Patient presents with  . Conjunctivitis     (Consider location/radiation/quality/duration/timing/severity/associated sxs/prior Treatment) Patient is a 54 y.o. female presenting with conjunctivitis.  Conjunctivitis Pertinent negatives include no shortness of breath.   patient presents with eye redness for the last month. States it began after getting makeup tattoos and using Aquaphor around her eyes. Since her eyes and became more red. States she has been on polymyxin eyedrops by her primary care doctor and improve somewhat then return. She states she saw her primary care again and was started on erythromycin ointment and Pataday drops. States she's continued to drain. She states she has crustiness. States he eyes itch. No relief with oral Benadryl at home either. Has had seasonal allergies but otherwise not very allergic.  Past Medical History  Diagnosis Date  . Bipolar disorder   . Hepatitis C   . Asthma   . Herpes simplex    Past Surgical History  Procedure Laterality Date  . Tonsillectomy    . Appendectomy     No family history on file. History  Substance Use Topics  . Smoking status: Former Smoker    Quit date: 05/26/2011  . Smokeless tobacco: Never Used  . Alcohol Use: No   OB History    No data available     Review of Systems  Constitutional: Negative for fever and chills.  Eyes: Positive for discharge, redness and itching. Negative for photophobia and visual disturbance.  Respiratory: Negative for shortness of breath and wheezing.       Allergies  Codeine  Home Medications   Prior to Admission medications   Medication Sig Start Date End Date Taking? Authorizing Provider  levothyroxine (SYNTHROID, LEVOTHROID) 75 MCG tablet Take 75 mcg by mouth daily before breakfast.   Yes Historical Provider, MD  gabapentin (NEURONTIN)  400 MG capsule Take 400-800 mg by mouth at bedtime.    Historical Provider, MD  lithium carbonate 300 MG capsule Take 300 mg by mouth 2 (two) times daily with a meal.    Historical Provider, MD  naproxen (NAPROSYN) 500 MG tablet Take 1 tablet (500 mg total) by mouth 2 (two) times daily with a meal. 06/27/12   Freeman CaldronMichael J Jeffery, PA-C  ondansetron (ZOFRAN) 4 MG tablet Take 1 tablet (4 mg total) by mouth every 6 (six) hours as needed for nausea. 06/27/12   Freeman CaldronMichael J Jeffery, PA-C  tiZANidine (ZANAFLEX) 4 MG tablet Take 1 tablet (4 mg total) by mouth every 8 (eight) hours as needed. 06/27/12   Freeman CaldronMichael J Jeffery, PA-C  traMADol (ULTRAM) 50 MG tablet Take 1-2 tablets (50-100 mg total) by mouth every 6 (six) hours as needed for pain. 06/27/12   Freeman CaldronMichael J Jeffery, PA-C  Vilazodone HCl 20 MG TABS Take 1 tablet (20 mg total) by mouth daily with breakfast. 06/27/12   Freeman CaldronMichael J Jeffery, PA-C   BP 126/90 mmHg  Pulse 78  Temp(Src) 98.2 F (36.8 C) (Oral)  Resp 18  Ht 5\' 2"  (1.575 m)  Wt 158 lb (71.668 kg)  BMI 28.89 kg/m2  SpO2 98% Physical Exam  Constitutional: She appears well-developed and well-nourished.  Eyes: EOM are normal.  Conjunctival injection. Some slight erythema and swelling. Slit-lamp done and showed some small punctate lesions diffusely for the cornea with floor seen uptake. No cell or flare.  Cardiovascular: Normal rate.  ED Course  Procedures (including critical care time) Labs Review Labs Reviewed - No data to display  Imaging Review No results found.   EKG Interpretation None      MDM   Final diagnoses:  Chemical conjunctivitis of both eyes    Patient with eye irritation. Already on Pataday drops and antibiotics. Will have follow-up with ophthalmology. Likely chemical or infectious conjunctivitis.    Benjiman CoreNathan Mikell Kazlauskas, MD 10/17/14 [phone]

## 2014-10-17 NOTE — ED Notes (Signed)
Patient c/o red/itching/irritated eyes for the past four weeks. Has been using antibiotic eye drops but no relief. Started pataday eyedrops & erythromycin ointment on Wednesday.

## 2014-12-16 ENCOUNTER — Emergency Department (HOSPITAL_BASED_OUTPATIENT_CLINIC_OR_DEPARTMENT_OTHER): Payer: Medicaid Other

## 2014-12-16 ENCOUNTER — Encounter (HOSPITAL_BASED_OUTPATIENT_CLINIC_OR_DEPARTMENT_OTHER): Payer: Self-pay

## 2014-12-16 ENCOUNTER — Emergency Department (HOSPITAL_BASED_OUTPATIENT_CLINIC_OR_DEPARTMENT_OTHER)
Admission: EM | Admit: 2014-12-16 | Discharge: 2014-12-16 | Disposition: A | Discharge status: Home / Self Care | Payer: Medicaid Other | Attending: Emergency Medicine | Admitting: Emergency Medicine

## 2014-12-16 DIAGNOSIS — J45909 Unspecified asthma, uncomplicated: Secondary | ICD-10-CM | POA: Insufficient documentation

## 2014-12-16 DIAGNOSIS — M546 Pain in thoracic spine: Secondary | ICD-10-CM

## 2014-12-16 DIAGNOSIS — Z79899 Other long term (current) drug therapy: Secondary | ICD-10-CM | POA: Insufficient documentation

## 2014-12-16 DIAGNOSIS — R0789 Other chest pain: Secondary | ICD-10-CM

## 2014-12-16 DIAGNOSIS — F319 Bipolar disorder, unspecified: Secondary | ICD-10-CM | POA: Insufficient documentation

## 2014-12-16 DIAGNOSIS — Z8619 Personal history of other infectious and parasitic diseases: Secondary | ICD-10-CM | POA: Insufficient documentation

## 2014-12-16 DIAGNOSIS — R079 Chest pain, unspecified: Secondary | ICD-10-CM

## 2014-12-16 DIAGNOSIS — Z87891 Personal history of nicotine dependence: Secondary | ICD-10-CM | POA: Insufficient documentation

## 2014-12-16 LAB — URINALYSIS, ROUTINE W REFLEX MICROSCOPIC
BILIRUBIN URINE: NEGATIVE
Glucose, UA: NEGATIVE mg/dL
Ketones, ur: NEGATIVE mg/dL
LEUKOCYTES UA: NEGATIVE
NITRITE: NEGATIVE
PH: 6 (ref 5.0–8.0)
Protein, ur: NEGATIVE mg/dL
SPECIFIC GRAVITY, URINE: 1.015 (ref 1.005–1.030)
Urobilinogen, UA: 0.2 mg/dL (ref 0.0–1.0)

## 2014-12-16 LAB — URINE MICROSCOPIC-ADD ON

## 2014-12-16 MED ORDER — NAPROXEN 500 MG PO TABS: 500.0000 mg | ORAL_TABLET | Freq: Two times a day (BID) | ORAL | Status: AC

## 2014-12-16 MED ORDER — CYCLOBENZAPRINE HCL 10 MG PO TABS: 10.0000 mg | ORAL_TABLET | Freq: Two times a day (BID) | ORAL | Status: AC | PRN

## 2014-12-16 MED ORDER — PROMETHAZINE HCL 25 MG/ML IJ SOLN: 12.5000 mg | Freq: Once | INTRAMUSCULAR | Status: DC
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
## 2014-12-16 NOTE — Discharge Instructions (Signed)
Chest x-ray negative. Suspect that this is musculoskeletal thoracic back pain. Take Flexeril as directed take the Naprosyn as directed. Work note provided to be out of work for 2 days. Return for any new or worse symptoms.

## 2014-12-16 NOTE — ED Notes (Signed)
Pt returned from xray

## 2014-12-16 NOTE — ED Provider Notes (Signed)
CSN: [id number]     Arrival date & time 12/16/14  0830 History   First MD Initiated Contact with Patient 12/16/14 [phone]     Chief Complaint  Patient presents with  . Back Pain     (Consider location/radiation/quality/duration/timing/severity/associated sxs/prior Treatment) Patient is a 54 y.o. female presenting with back pain. The history is provided by the patient.  Back Pain Associated symptoms: chest pain   Associated symptoms: no abdominal pain, no dysuria, no fever and no headaches    patients with a problem for a long period of time on the right posterior thoracic part of the back. Over the lower ribs. Them worse for the past 3-4 days. Patient took tramadol and an anti-inflammatory with temporary relief. Patient does not 1 any narcotics. No new injury. No shortness of breath no nausea or vomiting. As stated similar pain in the past just more persistent here for the past 4 days. Patient is a smoker.  Past Medical History  Diagnosis Date  . Bipolar disorder   . Hepatitis C   . Asthma   . Herpes simplex    Past Surgical History  Procedure Laterality Date  . Tonsillectomy    . Appendectomy     No family history on file. History  Substance Use Topics  . Smoking status: Former Smoker -- 0.50 packs/day    Types: Cigarettes    Quit date: 11/19/2014  . Smokeless tobacco: Never Used  . Alcohol Use: No   OB History    No data available     Review of Systems  Constitutional: Negative for fever.  HENT: Negative for congestion.   Eyes: Negative for redness.  Respiratory: Negative for shortness of breath.   Cardiovascular: Positive for chest pain.  Gastrointestinal: Negative for nausea, vomiting and abdominal pain.  Genitourinary: Negative for dysuria.  Musculoskeletal: Positive for back pain. Negative for neck pain.  Skin: Negative for rash.  Neurological: Negative for headaches.  Hematological: Does not bruise/bleed easily.  Psychiatric/Behavioral: Negative for confusion.       Allergies  Codeine  Home Medications   Prior to Admission medications   Medication Sig Start Date End Date Taking? Authorizing Provider  cyclobenzaprine (FLEXERIL) 10 MG tablet Take 1 tablet (10 mg total) by mouth 2 (two) times daily as needed for muscle spasms. 12/16/14   Vanetta Mulders, MD  gabapentin (NEURONTIN) 400 MG capsule Take 400-800 mg by mouth at bedtime.    Historical Provider, MD  levothyroxine (SYNTHROID, LEVOTHROID) 75 MCG tablet Take 75 mcg by mouth daily before breakfast.    Historical Provider, MD  lithium carbonate 300 MG capsule Take 300 mg by mouth 2 (two) times daily with a meal.    Historical Provider, MD  naproxen (NAPROSYN) 500 MG tablet Take 1 tablet (500 mg total) by mouth 2 (two) times daily. 12/16/14   Vanetta Mulders, MD  naproxen (NAPROSYN) 500 MG tablet Take 1 tablet (500 mg total) by mouth 2 (two) times daily with a meal. 06/27/12   Freeman Caldron, PA-C  ondansetron (ZOFRAN) 4 MG tablet Take 1 tablet (4 mg total) by mouth every 6 (six) hours as needed for nausea. 06/27/12   Freeman Caldron, PA-C  tiZANidine (ZANAFLEX) 4 MG tablet Take 1 tablet (4 mg total) by mouth every 8 (eight) hours as needed. 06/27/12   Freeman Caldron, PA-C  traMADol (ULTRAM) 50 MG tablet Take 1-2 tablets (50-100 mg total) by mouth every 6 (six) hours as needed for pain. 06/27/12   Nolon Bussing  Leotis ShamesJeffery, PA-C  Vilazodone HCl 20 MG TABS Take 1 tablet (20 mg total) by mouth daily with breakfast. 06/27/12   Freeman CaldronMichael J Jeffery, PA-C   BP 133/83 mmHg  Pulse 74  Temp(Src) 97.9 F (36.6 C) (Oral)  Resp 16  Ht 5' 1.5" (1.562 m)  Wt 158 lb 11.2 oz (71.986 kg)  BMI 29.50 kg/m2  SpO2 97% Physical Exam  Constitutional: She is oriented to person, place, and time. She appears well-developed and well-nourished. No distress.  HENT:  Head: Normocephalic and atraumatic.  Mouth/Throat: Oropharynx is clear and moist.  Eyes: Conjunctivae and EOM are normal. Pupils are equal, round, and reactive  to light.  Neck: Normal range of motion. Neck supple.  Cardiovascular: Normal rate, regular rhythm and normal heart sounds.   No murmur heard. Pulmonary/Chest: Effort normal and breath sounds normal. She exhibits tenderness.  Right posterior thoracic back over the lower ribs. No CVA tenderness.  Abdominal: Soft. Bowel sounds are normal. There is no tenderness.  Musculoskeletal: Normal range of motion.  Neurological: She is alert and oriented to person, place, and time. No cranial nerve deficit. She exhibits normal muscle tone. Coordination normal.  Skin: Skin is warm. No rash noted.  Nursing note and vitals reviewed.   ED Course  Procedures (including critical care time) Labs Review Labs Reviewed  URINALYSIS, ROUTINE W REFLEX [facility]CROSCOPIC [facility])    Imaging Review Dg Chest 2 View  12/16/2014   CLINICAL DATA:  Right back pain for 4 days, no known injury, chest pain  EXAM: CHEST  2 VIEW  COMPARISON:  05/14/2005  FINDINGS: Cardiomediastinal silhouette is stable. No acute infiltrate or pleural effusion. No pulmonary edema. Mild mid thoracic dextroscoliosis.  IMPRESSION: No active cardiopulmonary disease. Mild mid thoracic dextroscoliosis.   Electronically Signed   By: Natasha MeadLiviu  Pop M.D.   On: 12/16/2014 09:32     EKG Interpretation None      MDM   Final diagnoses:  Chest pain  Chest wall pain  Right-sided thoracic back pain     Patient with discomfort to the right lower thoracic back area on and off for a period of time. Worse in the past 4 days. Not associated with shortness of breath. No new injury. Clinically do not think this is consistent with a PE since is been present for a long period of time. Think it's musculoskeletal in nature. Patient will be treated with Flexeril and Naprosyn. Also do not think it's cardiac in nature.  Vanetta MuldersScott Dennette Faulconer, MD 12/16/14 [id number]

## 2014-12-16 NOTE — ED Notes (Signed)
MD at bedside. 

## 2016-07-10 IMAGING — CR DG CHEST 2V
2 series · 2 of 2 positions shown · non-contrast
Comparison: 05/14/2005

CLINICAL DATA: Right back pain for 4 days, no known injury, chest
pain

EXAM:
CHEST  2 VIEW

[w chest pa]
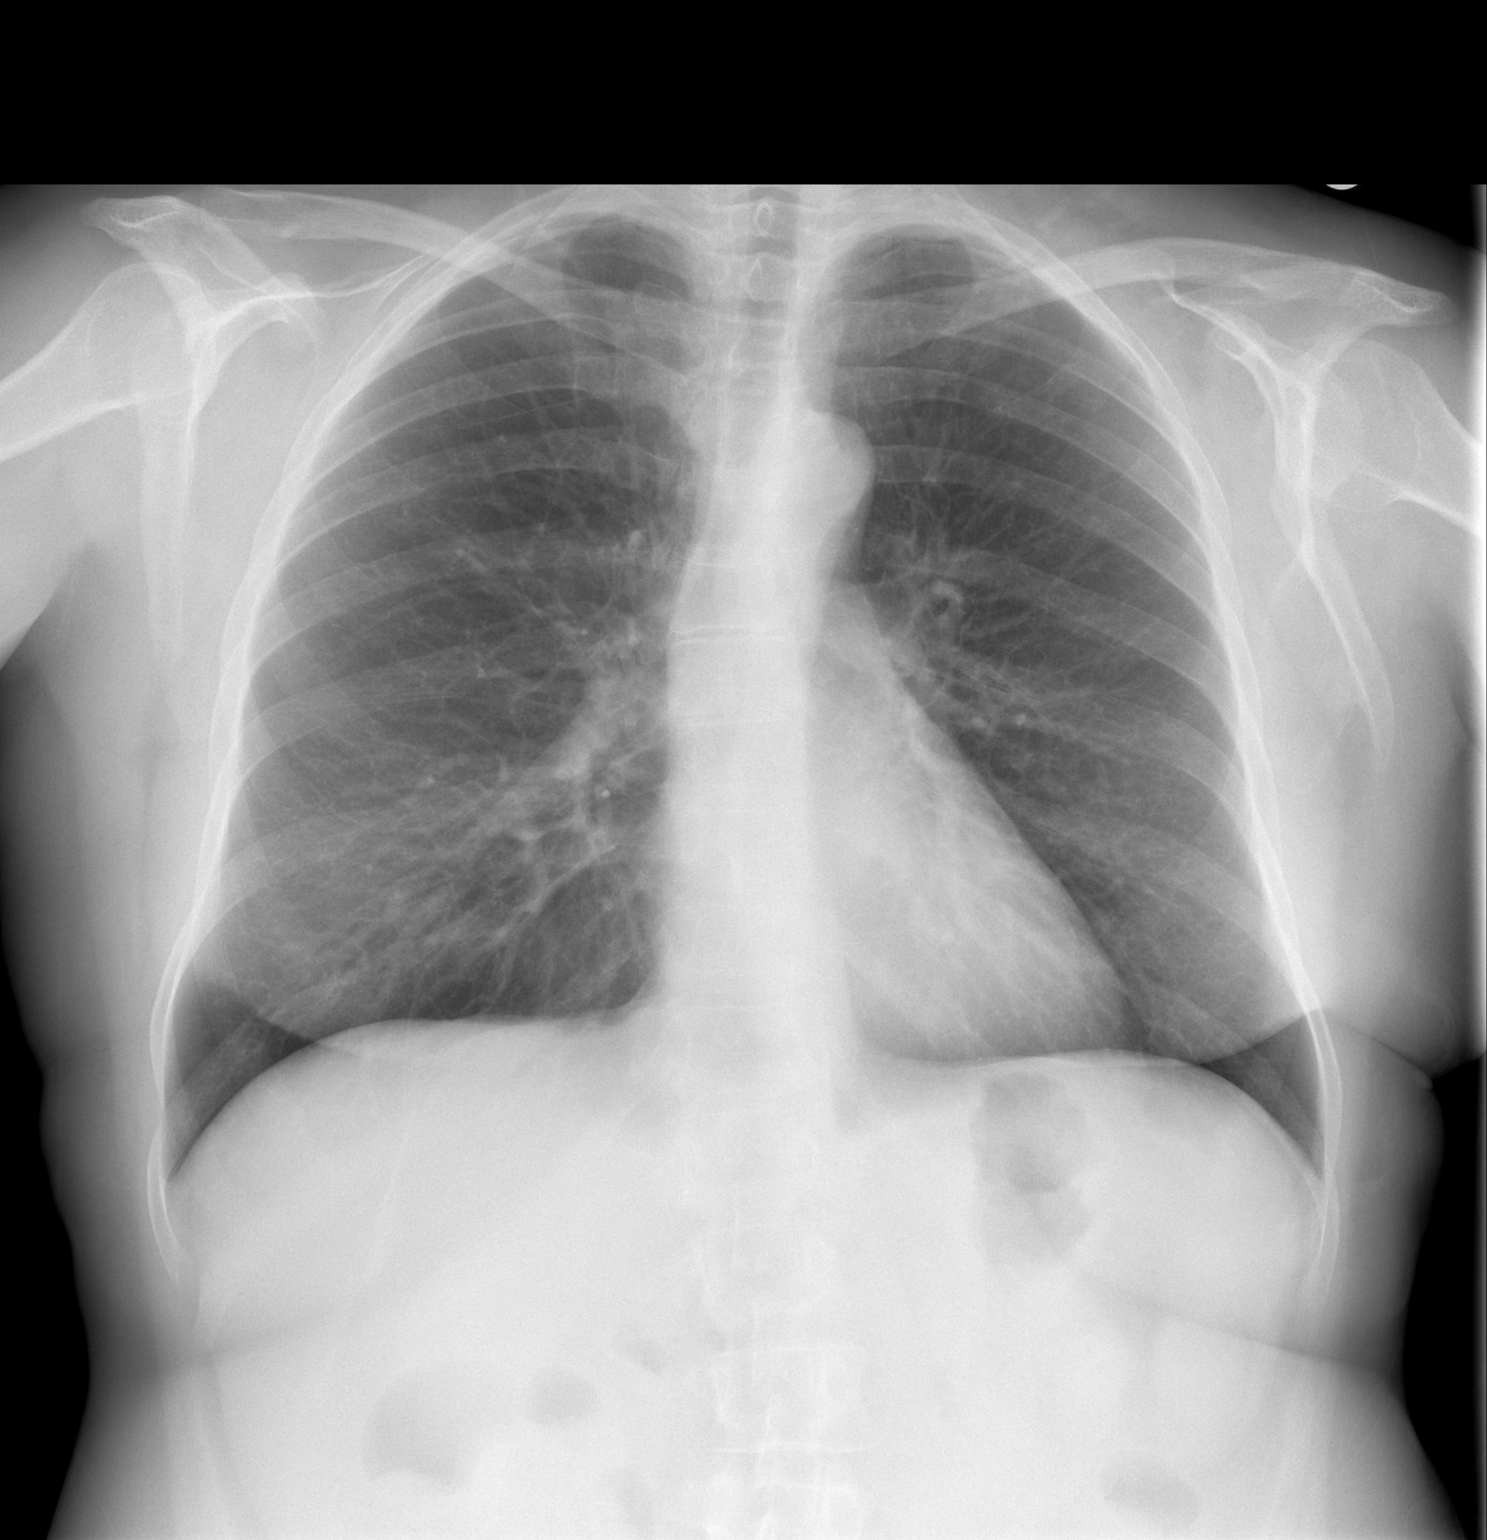

[w chest lat]
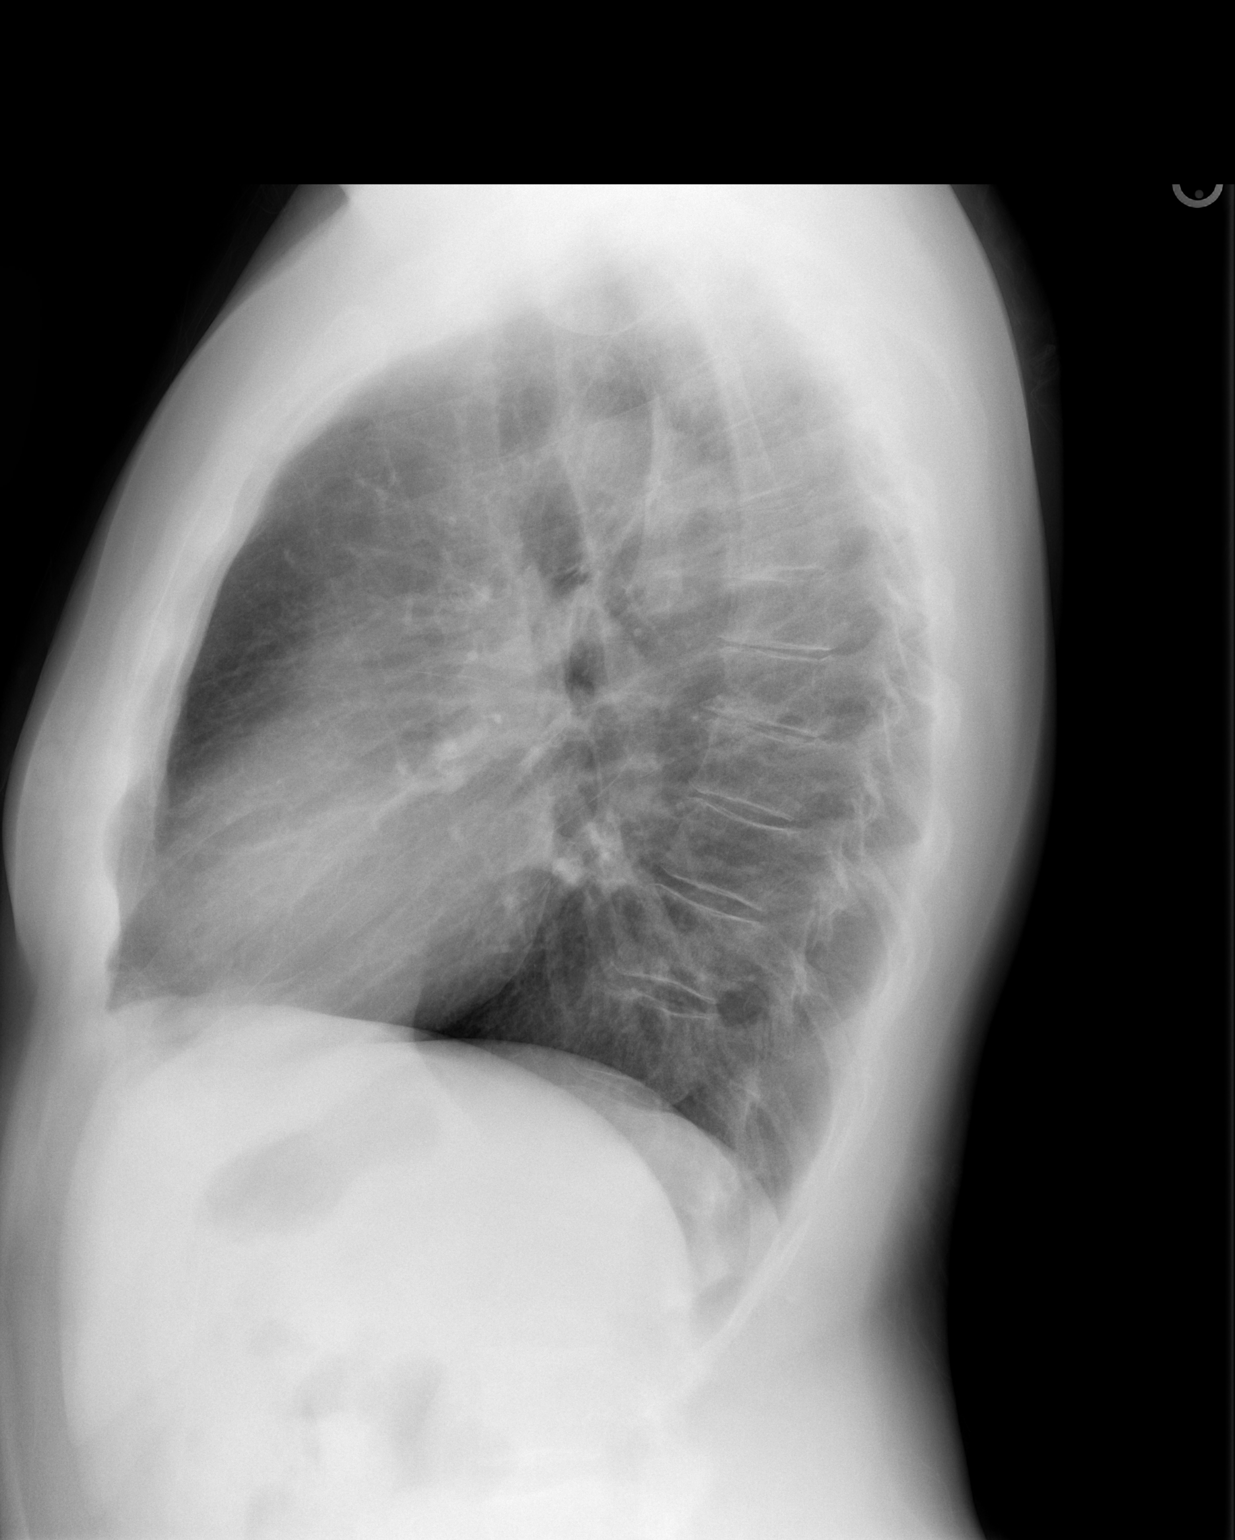

[2 of 2 positions shown; findings below may reference images not displayed]

FINDINGS: Cardiomediastinal silhouette is stable. No acute infiltrate or
pleural effusion. No pulmonary edema. Mild mid thoracic
dextroscoliosis.
IMPRESSION: No active cardiopulmonary disease. Mild mid thoracic
dextroscoliosis.

## 2021-01-17 ENCOUNTER — Other Ambulatory Visit: Payer: Self-pay

## 2021-01-17 ENCOUNTER — Ambulatory Visit (LOCAL_COMMUNITY_HEALTH_CENTER): Payer: Self-pay | Admitting: Nurse Practitioner

## 2021-01-17 DIAGNOSIS — Z111 Encounter for screening for respiratory tuberculosis: Secondary | ICD-10-CM

## 2021-01-17 NOTE — Progress Notes (Signed)
60 year old female seen in clinic today for a PPD placement. Glenna Fellows, RN

## 2022-11-02 ENCOUNTER — Ambulatory Visit
Admission: RE | Admit: 2022-11-02 | Discharge: 2022-11-02 | Disposition: A | Discharge status: Home / Self Care | Payer: Self-pay | Source: Ambulatory Visit | Attending: Urgent Care | Admitting: Urgent Care

## 2022-11-02 VITALS — BP 128/78 | HR 72 | Temp 99.6°F | Resp 16

## 2022-11-02 DIAGNOSIS — N3001 Acute cystitis with hematuria: Secondary | ICD-10-CM

## 2022-11-02 DIAGNOSIS — B009 Herpesviral infection, unspecified: Secondary | ICD-10-CM

## 2022-11-02 LAB — POCT URINALYSIS DIP (MANUAL ENTRY)
Bilirubin, UA: NEGATIVE
Glucose, UA: NEGATIVE mg/dL
Ketones, POC UA: NEGATIVE mg/dL
Nitrite, UA: NEGATIVE
Spec Grav, UA: >=1.03 — AB (ref 1.010–1.025)
Urobilinogen, UA: 0.2 E.U./dL
pH, UA: 5.5 (ref 5.0–8.0)

## 2022-11-02 MED ORDER — VALACYCLOVIR HCL 1 G PO TABS: 1000.0000 mg | ORAL_TABLET | Freq: Every day | ORAL | 0 refills | Status: AC

## 2022-11-02 MED ORDER — NITROFURANTOIN MONOHYD MACRO 100 MG PO CAPS: 100.0000 mg | ORAL_CAPSULE | Freq: Two times a day (BID) | ORAL | 0 refills | Status: AC
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
## 2022-11-02 NOTE — ED Triage Notes (Signed)
Patient presents to UC for dysuria, hematuria x 1.5 weeks. Has noted some blood clots in urine. Also requesting medication refill for herpes. States she has a flare-up.

## 2022-11-02 NOTE — Discharge Instructions (Signed)
Follow up here or with your primary care provider if your symptoms are worsening or not improving.     

## 2023-10-03 DIAGNOSIS — L237 Allergic contact dermatitis due to plants, except food: Secondary | ICD-10-CM | POA: Diagnosis not present

## 2023-10-08 DIAGNOSIS — Z720 Tobacco use: Secondary | ICD-10-CM | POA: Diagnosis not present

## 2023-10-08 DIAGNOSIS — J4520 Mild intermittent asthma, uncomplicated: Secondary | ICD-10-CM | POA: Diagnosis not present

## 2023-10-08 DIAGNOSIS — N6315 Unspecified lump in the right breast, overlapping quadrants: Secondary | ICD-10-CM | POA: Diagnosis not present
